# Patient Record
Sex: Female | Born: 1975 | Race: Black or African American | Hispanic: No | Marital: Single | Smoking: Never smoker
Health system: Southern US, Community
[De-identification: ages and names within clinical notes are randomized; demographics above are authoritative.]

## PROBLEM LIST (undated history)

## (undated) ENCOUNTER — Inpatient Hospital Stay (HOSPITAL_COMMUNITY): Payer: Self-pay

## (undated) DIAGNOSIS — Z789 Other specified health status: Secondary | ICD-10-CM

## (undated) DIAGNOSIS — D219 Benign neoplasm of connective and other soft tissue, unspecified: Secondary | ICD-10-CM

---

## 2002-03-27 ENCOUNTER — Other Ambulatory Visit: Admission: RE | Admit: 2002-03-27 | Discharge: 2002-03-27 | Payer: Self-pay | Admitting: *Deleted

## 2003-11-05 ENCOUNTER — Other Ambulatory Visit: Admission: RE | Admit: 2003-11-05 | Discharge: 2003-11-05 | Payer: Self-pay | Admitting: Obstetrics and Gynecology

## 2006-04-20 ENCOUNTER — Other Ambulatory Visit: Admission: RE | Admit: 2006-04-20 | Discharge: 2006-04-20 | Payer: Self-pay | Admitting: Family Medicine

## 2008-06-13 ENCOUNTER — Encounter (INDEPENDENT_AMBULATORY_CARE_PROVIDER_SITE_OTHER): Payer: Self-pay | Admitting: Obstetrics & Gynecology

## 2008-06-13 ENCOUNTER — Ambulatory Visit: Payer: Self-pay | Admitting: Obstetrics & Gynecology

## 2010-10-07 NOTE — Group Therapy Note (Signed)
Tracy Orr, HULEN NO.:  [id number]   MEDICAL RECORD NO.:  [id number]          PATIENT TYPE:  WOC   LOCATION:  [facility]                   FACILITY:  [facility]   PHYSICIAN:  Dorthula Perfect, MD     DATE OF BIRTH:  19-Nov-1975   DATE OF SERVICE:  06/13/2008                                  CLINIC NOTE   A 35 year old black female, gravida 0, last menstrual period May 24, 2008 comes in for annual exam.  Her last examination including Pap  smear was roughly 5-7 years ago.  When she was younger she said she  had an abnormal Pap smear but after that they were normal.  She and  her boyfriend use condoms for contraception.  She has no problems with  her periods.  She has occasional slight vaginal discharge premenstrually  but this might be getting better since she has changed her bath soap.  She has no GI or GU symptoms.   FAMILY HISTORY:  Negative for breast cancer, colon cancer and ovarian  cancer.   PHYSICAL EXAM:  Height 5 feet 9, weight 179 pounds, blood pressure  125/83.  Thyroid completely normal.  BREAST EXAM:  Her breasts are bilaterally normal without cysts or  tenderness.  In the right __________ is a subcutaneous firm but soft  area about 12 mm x 6 mm.  This is most likely a follicular cyst with  hair retention secondary to shaving of the axilla.  She occasionally  sees this similarly in the left axilla.  ABDOMEN:  The abdomen is flat, soft and nontender.  No masses are  palpable.  PELVIC EXAM:  External genitalia, BUS glands are normal.  The vaginal  vault is completely normal.  The cervix is epithelialized and completely  normal.  The uterus is in the midline and is of normal size and shape.  It is nontender.  The ovaries are bilaterally normal.  Adnexal areas are  negative.   IMPRESSION:  Normal gynecology exam.   DISPOSITION:  1. Pap smear.  2. The patient asked if she can at some point go to Pap smears every 2-      3 years.  I told her that  if she will have 2-3 annual Pap smears      that are all normal, then she can certainly consider that.           ______________________________  Dorthula Perfect, MD     ER/MEDQ  D:  06/13/2008  T:  06/13/2008  Job:  [id number]

## 2011-05-25 ENCOUNTER — Other Ambulatory Visit (HOSPITAL_COMMUNITY)
Admission: RE | Admit: 2011-05-25 | Discharge: 2011-05-25 | Disposition: A | Discharge status: Home / Self Care | Payer: BC Managed Care – PPO | Source: Ambulatory Visit | Attending: Obstetrics and Gynecology | Admitting: Obstetrics and Gynecology

## 2011-05-25 ENCOUNTER — Other Ambulatory Visit: Payer: Self-pay | Admitting: Obstetrics and Gynecology

## 2011-05-25 DIAGNOSIS — Z124 Encounter for screening for malignant neoplasm of cervix: Secondary | ICD-10-CM | POA: Insufficient documentation

## 2011-05-25 DIAGNOSIS — Z113 Encounter for screening for infections with a predominantly sexual mode of transmission: Secondary | ICD-10-CM | POA: Insufficient documentation

## 2011-05-25 DIAGNOSIS — Z1159 Encounter for screening for other viral diseases: Secondary | ICD-10-CM | POA: Insufficient documentation

## 2011-06-03 ENCOUNTER — Inpatient Hospital Stay (HOSPITAL_COMMUNITY)
Admission: AD | Admit: 2011-06-03 | Discharge: 2011-06-03 | Disposition: A | Discharge status: Home / Self Care | Payer: BC Managed Care – PPO | Source: Ambulatory Visit | Attending: Obstetrics and Gynecology | Admitting: Obstetrics and Gynecology

## 2011-06-03 ENCOUNTER — Inpatient Hospital Stay (HOSPITAL_COMMUNITY): Payer: BC Managed Care – PPO

## 2011-06-03 ENCOUNTER — Encounter (HOSPITAL_COMMUNITY): Payer: Self-pay

## 2011-06-03 DIAGNOSIS — O99891 Other specified diseases and conditions complicating pregnancy: Secondary | ICD-10-CM | POA: Insufficient documentation

## 2011-06-03 DIAGNOSIS — O3410 Maternal care for benign tumor of corpus uteri, unspecified trimester: Secondary | ICD-10-CM | POA: Insufficient documentation

## 2011-06-03 DIAGNOSIS — R109 Unspecified abdominal pain: Secondary | ICD-10-CM | POA: Insufficient documentation

## 2011-06-03 DIAGNOSIS — D259 Leiomyoma of uterus, unspecified: Secondary | ICD-10-CM | POA: Insufficient documentation

## 2011-06-03 DIAGNOSIS — D219 Benign neoplasm of connective and other soft tissue, unspecified: Secondary | ICD-10-CM

## 2011-06-03 HISTORY — DX: Other specified health status: Z78.9

## 2011-06-03 LAB — WET PREP, GENITAL
Trich, Wet Prep: NONE SEEN
WBC, Wet Prep HPF POC: NONE SEEN
Yeast Wet Prep HPF POC: NONE SEEN

## 2011-06-03 LAB — CBC
Hemoglobin: 13.2 g/dL (ref 12.0–15.0)
Platelets: 230 10*3/uL (ref 150–400)
RBC: 4.25 MIL/uL (ref 3.87–5.11)

## 2011-06-03 LAB — URINALYSIS, ROUTINE W REFLEX MICROSCOPIC
Bilirubin Urine: NEGATIVE
Glucose, UA: NEGATIVE mg/dL
Hgb urine dipstick: NEGATIVE
Specific Gravity, Urine: 1.02 (ref 1.005–1.030)
pH: 7 (ref 5.0–8.0)

## 2011-06-03 LAB — HCG, QUANTITATIVE, PREGNANCY: hCG, Beta Chain, Quant, S: 58656 m[IU]/mL — ABNORMAL HIGH (ref <5)

## 2011-06-03 NOTE — Progress Notes (Signed)
Patient states she has had a positive pregnancy test in Dr. Paralee Cancel office. Has some cramping today but none at this time. Patient denies any bleeding. Was treated for a pos CT last week.

## 2011-06-03 NOTE — ED Provider Notes (Signed)
History     Chief Complaint  Patient presents with  . Abdominal Pain   HPIAngela L Orr is 36 y.o. G1P0 [redacted]w[redacted]d weeks presenting with sharp mid abdominal pain.  Spoke to Tracy Orr who sent her here for further evaluation.  LMP 11/15, normal for her.  Had a + test end of Dec. In office.   Was dx and treated for Chlamydia last week.  Partner treated.  Denies vaginal bleeding or vaginal discharge.  Pain began at 1pm today.  ? If something she ate.  That pain has resolved but has occ cramping.      Past Medical History  Diagnosis Date  . No pertinent past medical history     History reviewed. No pertinent past surgical history.  Family History  Problem Relation Age of Onset  . Diabetes Mother   . Hypertension Mother   . Hypertension Father   . Hypertension Sister     History  Substance Use Topics  . Smoking status: Never Smoker   . Smokeless tobacco: Not on file  . Alcohol Use: No    Allergies: Allergies not on file  No prescriptions prior to admission    Review of Systems  Constitutional: Negative for fever and chills.  Gastrointestinal: Positive for nausea and abdominal pain (sharp mid abdomen). Negative for vomiting, diarrhea and constipation.  Genitourinary: Negative.   Neurological: Negative for headaches.   Physical Exam   Blood pressure 121/77, pulse 89, temperature 98.7 F (37.1 C), temperature source Oral, resp. rate 20, height 5\' 9"  (1.753 Orr), weight 188 lb 9.6 oz (85.548 kg), last menstrual period 04/08/2011, SpO2 99.00%.  Physical Exam  Constitutional: She is oriented to person, place, and time. She appears well-developed and well-nourished. No distress.  HENT:  Head: Normocephalic.  Neck: Normal range of motion.  Cardiovascular: Normal rate.   Respiratory: Effort normal.  GI: Soft. She exhibits no distension and no mass. There is no tenderness. There is no rebound and no guarding.  Genitourinary: Uterus is enlarged (slightly). Uterus is not tender.  Right adnexum displays no mass, no tenderness and no fullness. Left adnexum displays no mass, no tenderness and no fullness. No bleeding around the vagina. Vaginal discharge (frothy discharge with odor) found.  Neurological: She is alert and oriented to person, place, and time.  Skin: Skin is warm and dry.  Psychiatric: She has a normal mood and affect. Her behavior is normal.   Results for orders placed during the hospital encounter of 06/03/11 (from the past 24 hour(s))  URINALYSIS, ROUTINE W REFLEX MICROSCOPIC     Status: Normal   Collection Time   06/03/11  6:40 PM      Component Value Range   Color, Urine YELLOW  YELLOW    APPearance CLEAR  CLEAR    Specific Gravity, Urine 1.020  1.005 - 1.030    pH 7.0  5.0 - 8.0    Glucose, UA NEGATIVE  NEGATIVE (mg/dL)   Hgb urine dipstick NEGATIVE  NEGATIVE    Bilirubin Urine NEGATIVE  NEGATIVE    Ketones, ur NEGATIVE  NEGATIVE (mg/dL)   Protein, ur NEGATIVE  NEGATIVE (mg/dL)   Urobilinogen, UA 0.2  0.0 - 1.0 (mg/dL)   Nitrite NEGATIVE  NEGATIVE    Leukocytes, UA NEGATIVE  NEGATIVE   WET PREP, GENITAL     Status: Abnormal   Collection Time   06/03/11  6:55 PM      Component Value Range   Yeast, Wet Prep NONE SEEN  NONE  SEEN    Trich, Wet Prep NONE SEEN  NONE SEEN    Clue Cells, Wet Prep FEW (*) NONE SEEN    WBC, Wet Prep HPF POC NONE SEEN  NONE SEEN   CBC     Status: Normal   Collection Time   06/03/11  7:30 PM      Component Value Range   WBC 9.6  4.0 - 10.5 (K/uL)   RBC 4.25  3.87 - 5.11 (MIL/uL)   Hemoglobin 13.2  12.0 - 15.0 (g/dL)   HCT 16.1  09.6 - 04.5 (%)   MCV 90.4  78.0 - 100.0 (fL)   MCH 31.1  26.0 - 34.0 (pg)   MCHC 34.4  30.0 - 36.0 (g/dL)   RDW 40.9  81.1 - 91.4 (%)   Platelets 230  150 - 400 (K/uL)  ABO/RH     Status: Normal   Collection Time   06/03/11  7:41 PM      Component Value Range   ABO/RH(D) O POS    HCG, QUANTITATIVE, PREGNANCY     Status: Abnormal   Collection Time   06/03/11  7:41 PM      Component Value  Range   hCG, Beta Chain, Quant, S [id number] (*) <5 (mIU/mL)   MAU Course  Procedures  TEST OF CURE has been scheduled in Dr. Paralee Cancel office next week.  Not repeated today.  MDM 21:00 care turned over to Tracy Orr, CNM. Assessment and Plan  A:  Abdominal pain in early pregnancy  P:  Keep your scheduled appointment with Tracy Orr.    Tracy Orr 06/03/2011, 6:57 PM   Tracy Holmes, NP 06/03/11 2055  Ultrasound results:  IUP at 6.6 wks; +fetal heart rate 144; multiple fibroids.    A:  Fibroids IUP  Plan: DC to home Keep scheduled appointment.  [facility]

## 2011-06-16 ENCOUNTER — Other Ambulatory Visit: Payer: Self-pay

## 2011-06-23 ENCOUNTER — Other Ambulatory Visit: Payer: Self-pay | Admitting: Obstetrics and Gynecology

## 2011-06-23 DIAGNOSIS — O09529 Supervision of elderly multigravida, unspecified trimester: Secondary | ICD-10-CM

## 2011-06-23 DIAGNOSIS — Z3689 Encounter for other specified antenatal screening: Secondary | ICD-10-CM

## 2011-08-05 ENCOUNTER — Encounter (HOSPITAL_COMMUNITY): Payer: Self-pay

## 2011-08-19 ENCOUNTER — Encounter (HOSPITAL_COMMUNITY): Payer: Self-pay

## 2011-08-19 ENCOUNTER — Ambulatory Visit (HOSPITAL_COMMUNITY)
Admission: RE | Admit: 2011-08-19 | Discharge: 2011-08-19 | Disposition: A | Discharge status: Home / Self Care | Payer: BC Managed Care – PPO | Source: Ambulatory Visit | Attending: Obstetrics and Gynecology | Admitting: Obstetrics and Gynecology

## 2011-08-19 DIAGNOSIS — O3410 Maternal care for benign tumor of corpus uteri, unspecified trimester: Secondary | ICD-10-CM | POA: Insufficient documentation

## 2011-08-19 DIAGNOSIS — O09519 Supervision of elderly primigravida, unspecified trimester: Secondary | ICD-10-CM | POA: Insufficient documentation

## 2011-08-19 DIAGNOSIS — Z1389 Encounter for screening for other disorder: Secondary | ICD-10-CM | POA: Insufficient documentation

## 2011-08-19 DIAGNOSIS — Z363 Encounter for antenatal screening for malformations: Secondary | ICD-10-CM | POA: Insufficient documentation

## 2011-08-19 DIAGNOSIS — O09529 Supervision of elderly multigravida, unspecified trimester: Secondary | ICD-10-CM

## 2011-08-19 DIAGNOSIS — O358XX0 Maternal care for other (suspected) fetal abnormality and damage, not applicable or unspecified: Secondary | ICD-10-CM | POA: Insufficient documentation

## 2011-08-19 DIAGNOSIS — Z3689 Encounter for other specified antenatal screening: Secondary | ICD-10-CM

## 2011-08-25 NOTE — Progress Notes (Signed)
Genetic Counseling  High-Risk Gestation Note  Appointment Date: 08/18/11 Referred By: Fortino Sic, MD Date of Birth:  Nov 01, 1975  Pregnancy History: G1P0000 Estimated Date of Delivery: 01/21/12 Estimated Gestational Age: [redacted]w[redacted]d  Ms. Shelva Majestic was seen for genetic counseling because of a maternal age of 36.     She was counseled regarding maternal age and the association with risk for chromosome conditions due to nondisjunction with aging of the ova.   We reviewed chromosomes, nondisjunction, and the associated 1 in 125 risk for fetal aneuploidy related to a maternal age of 36 y.o. at [redacted]w[redacted]d gestation.  She was/They were counseled that the risk for aneuploidy decreases as gestational age increases, accounting for those pregnancies which spontaneously abort.  We specifically discussed Down syndrome (trisomy 38), trisomies 57 and 75, and sex chromosome aneuploidies (47,XXX and 47,XXY) including the common features and prognoses of each.   We reviewed available screening and diagnostic options.  Regarding screening tests, we discussed the options of Quad screen and ultrasound. In addition, we discussed another type of screening test, noninvasive prenatal testing (NIPT), which utilizes cell free fetal DNA found in the maternal circulation. This test is not diagnostic for chromosome conditions, but can provide information regarding the presence or absence of extra fetal DNA for chromosomes 13, 18 and 21. Thus, it would not identify or rule out all fetal aneuploidy. The reported detection rate is greater than 99% for Trisomy 21, greater than 97% for Trisomy 18, and is approximately 80% (8 out of 10) for Trisomy 13. The false positive rate is reported to be less than 1% for any of these conditions.  She understands that screening tests are used to modify a patient's a priori risk for aneuploidy, typically based on age.  This estimate provides a pregnancy specific risk assessment.    We also reviewed  the availability of diagnostic testing via amniocentesis.  We discussed the risks, limitations, and benefits of each.  After reviewing these options, Ms. Neely elected to have ultrasound, but declined all other screening and diagnostic options. She understands that ultrasound cannot rule out all birth defects or genetic syndromes. The patient was advised of this limitation and states she still does not want diagnostic testing at this time.  However, she was counseled that 50-80% of fetuses with Down syndrome and up to 90% of fetuses with trisomies 13 and 18, when well visualized, have detectable anomalies or soft markers by ultrasound.   Ms. Burck was provided with written information regarding sickle cell anemia (SCA) including the carrier frequency and incidence in the Hispanic/African-American population, the availability of carrier testing and prenatal diagnosis if indicated.  In addition, we discussed that hemoglobinopathies are routinely screened for as part of the [place] newborn screening panel.  She declined hemoglobin electrophoresis today.  Both family histories were reviewed and found to be noncontributory for birth defects, mental retardation, and known genetic conditions. Without further information regarding the provided family history, an accurate genetic risk cannot be calculated. Further genetic counseling is warranted if more information is obtained.  Ms. Pekar denied exposure to environmental toxins or chemical agents. She denied the use of tobacco and street drugs.  She reported that she had 2 or 3 glasses of wine during the first 4 weeks of pregnancy.  We reviewed the all or none period and the timing of organogenesis.  She denied significant viral illnesses during the course of her pregnancy. Her medical and surgical histories were noncontributory.   I counseled Ms.  Beery regarding the above risks and available options.  The approximate face-to-face time with the genetic counselor was 40  minutes.  Donald Prose, MS  Certified Genetic Counselor

## 2011-12-31 ENCOUNTER — Encounter (HOSPITAL_COMMUNITY): Payer: Self-pay | Admitting: *Deleted

## 2011-12-31 ENCOUNTER — Inpatient Hospital Stay (HOSPITAL_COMMUNITY)
Admission: AD | Admit: 2011-12-31 | Discharge: 2012-01-01 | Disposition: A | Discharge status: Home / Self Care | Payer: BC Managed Care – PPO | Source: Ambulatory Visit | Attending: Obstetrics & Gynecology | Admitting: Obstetrics & Gynecology

## 2011-12-31 DIAGNOSIS — O479 False labor, unspecified: Secondary | ICD-10-CM | POA: Insufficient documentation

## 2011-12-31 NOTE — Progress Notes (Signed)
Pt states she had fibroids

## 2011-12-31 NOTE — MAU Note (Signed)
Pt states pain started at 1900 q 4-5 min.Pt states since she has been here her pains have subsided

## 2011-12-31 NOTE — MAU Note (Signed)
Pt G1 at 37wks having contractions every since 1930 and feeling pressure.  Denies bleeding or leaking fluid.

## 2012-01-01 NOTE — Progress Notes (Signed)
Pt sitting up in bed loss  Of contact noted

## 2012-01-18 ENCOUNTER — Encounter (HOSPITAL_COMMUNITY): Payer: Self-pay | Admitting: *Deleted

## 2012-01-18 ENCOUNTER — Inpatient Hospital Stay (HOSPITAL_COMMUNITY): Payer: BC Managed Care – PPO | Admitting: Anesthesiology

## 2012-01-18 ENCOUNTER — Encounter (HOSPITAL_COMMUNITY): Payer: Self-pay | Admitting: Anesthesiology

## 2012-01-18 ENCOUNTER — Inpatient Hospital Stay (HOSPITAL_COMMUNITY)
Admission: AD | Admit: 2012-01-18 | Discharge: 2012-01-21 | DRG: 372 | Disposition: A | Discharge status: Home / Self Care | Payer: BC Managed Care – PPO | Source: Ambulatory Visit | Attending: Obstetrics and Gynecology | Admitting: Obstetrics and Gynecology

## 2012-01-18 DIAGNOSIS — D259 Leiomyoma of uterus, unspecified: Secondary | ICD-10-CM | POA: Diagnosis present

## 2012-01-18 DIAGNOSIS — O09519 Supervision of elderly primigravida, unspecified trimester: Secondary | ICD-10-CM | POA: Diagnosis present

## 2012-01-18 DIAGNOSIS — O34599 Maternal care for other abnormalities of gravid uterus, unspecified trimester: Secondary | ICD-10-CM | POA: Diagnosis present

## 2012-01-18 DIAGNOSIS — O4690 Antepartum hemorrhage, unspecified, unspecified trimester: Secondary | ICD-10-CM | POA: Diagnosis present

## 2012-01-18 DIAGNOSIS — D4959 Neoplasm of unspecified behavior of other genitourinary organ: Secondary | ICD-10-CM | POA: Diagnosis present

## 2012-01-18 HISTORY — DX: Benign neoplasm of connective and other soft tissue, unspecified: D21.9

## 2012-01-18 LAB — OB RESULTS CONSOLE HEPATITIS B SURFACE ANTIGEN: Hepatitis B Surface Ag: NEGATIVE

## 2012-01-18 LAB — CBC
Hemoglobin: 12.9 g/dL (ref 12.0–15.0)
RBC: 4.31 MIL/uL (ref 3.87–5.11)
WBC: 12.8 10*3/uL — ABNORMAL HIGH (ref 4.0–10.5)

## 2012-01-18 LAB — OB RESULTS CONSOLE HIV ANTIBODY (ROUTINE TESTING): HIV: NONREACTIVE

## 2012-01-18 LAB — OB RESULTS CONSOLE GBS: GBS: NEGATIVE

## 2012-01-18 MED ORDER — LIDOCAINE HCL (PF) 1 % IJ SOLN
INTRAMUSCULAR | Status: DC | PRN
  Administered 2012-01-18 (×4): 4 mL

## 2012-01-18 MED ORDER — LACTATED RINGERS IV SOLN: 500.0000 mL | Freq: Once | INTRAVENOUS | Status: DC

## 2012-01-18 MED ORDER — OXYTOCIN BOLUS FROM INFUSION
250.0000 mL | Freq: Once | INTRAVENOUS | Status: AC
  Administered 2012-01-19: 250 mL via INTRAVENOUS
  Filled 2012-01-18: qty 500

## 2012-01-18 MED ORDER — OXYTOCIN 40 UNITS IN LACTATED RINGERS INFUSION - SIMPLE MED: 1.0000 m[IU]/min | INTRAVENOUS | Status: DC

## 2012-01-18 MED ORDER — LACTATED RINGERS IV SOLN
INTRAVENOUS | Status: DC
  Administered 2012-01-18: 100 mL/h via INTRAUTERINE
  Administered 2012-01-18: 500 mL via INTRAUTERINE

## 2012-01-18 MED ORDER — LIDOCAINE HCL (PF) 1 % IJ SOLN
30.0000 mL | INTRAMUSCULAR | Status: DC | PRN
  Filled 2012-01-18: qty 30

## 2012-01-18 MED ORDER — TERBUTALINE SULFATE 1 MG/ML IJ SOLN: 0.2500 mg | Freq: Once | INTRAMUSCULAR | Status: AC | PRN

## 2012-01-18 MED ORDER — OXYCODONE-ACETAMINOPHEN 5-325 MG PO TABS
1.0000 | ORAL_TABLET | ORAL | Status: DC | PRN
  Administered 2012-01-19: 1 via ORAL
  Filled 2012-01-18: qty 1

## 2012-01-18 MED ORDER — FENTANYL 2.5 MCG/ML BUPIVACAINE 1/10 % EPIDURAL INFUSION (WH - ANES)
14.0000 mL/h | INTRAMUSCULAR | Status: DC
  Administered 2012-01-18: 14 mL/h via EPIDURAL
  Administered 2012-01-18: 16 mL/h via EPIDURAL
  Administered 2012-01-18: 14 mL/h via EPIDURAL
  Filled 2012-01-18 (×3): qty 60

## 2012-01-18 MED ORDER — DIPHENHYDRAMINE HCL 50 MG/ML IJ SOLN: 12.5000 mg | INTRAMUSCULAR | Status: DC | PRN

## 2012-01-18 MED ORDER — LACTATED RINGERS IV SOLN
INTRAVENOUS | Status: DC
  Administered 2012-01-18: 125 mL/h via INTRAVENOUS

## 2012-01-18 MED ORDER — ONDANSETRON HCL 4 MG/2ML IJ SOLN
4.0000 mg | Freq: Four times a day (QID) | INTRAMUSCULAR | Status: DC | PRN
  Administered 2012-01-18 (×2): 4 mg via INTRAVENOUS
  Filled 2012-01-18 (×2): qty 2

## 2012-01-18 MED ORDER — LACTATED RINGERS IV SOLN
500.0000 mL | INTRAVENOUS | Status: DC | PRN
  Administered 2012-01-18: 1000 mL via INTRAVENOUS
  Administered 2012-01-18: 300 mL via INTRAVENOUS
  Administered 2012-01-19: 500 mL via INTRAVENOUS

## 2012-01-18 MED ORDER — PHENYLEPHRINE 40 MCG/ML (10ML) SYRINGE FOR IV PUSH (FOR BLOOD PRESSURE SUPPORT): 80.0000 ug | PREFILLED_SYRINGE | INTRAVENOUS | Status: DC | PRN

## 2012-01-18 MED ORDER — OXYTOCIN 40 UNITS IN LACTATED RINGERS INFUSION - SIMPLE MED
62.5000 mL/h | Freq: Once | INTRAVENOUS | Status: DC
  Filled 2012-01-18: qty 1000

## 2012-01-18 MED ORDER — EPHEDRINE 5 MG/ML INJ: 10.0000 mg | INTRAVENOUS | Status: DC | PRN

## 2012-01-18 MED ORDER — IBUPROFEN 600 MG PO TABS
600.0000 mg | ORAL_TABLET | Freq: Four times a day (QID) | ORAL | Status: DC | PRN
  Administered 2012-01-19: 600 mg via ORAL
  Filled 2012-01-18: qty 1

## 2012-01-18 MED ORDER — FLEET ENEMA 7-19 GM/118ML RE ENEM: 1.0000 | ENEMA | RECTAL | Status: DC | PRN

## 2012-01-18 MED ORDER — CITRIC ACID-SODIUM CITRATE 334-500 MG/5ML PO SOLN: 30.0000 mL | ORAL | Status: DC | PRN

## 2012-01-18 MED ORDER — ACETAMINOPHEN 325 MG PO TABS: 650.0000 mg | ORAL_TABLET | ORAL | Status: DC | PRN

## 2012-01-18 MED ORDER — EPHEDRINE 5 MG/ML INJ
10.0000 mg | INTRAVENOUS | Status: DC | PRN
  Filled 2012-01-18: qty 4

## 2012-01-18 MED ORDER — PHENYLEPHRINE 40 MCG/ML (10ML) SYRINGE FOR IV PUSH (FOR BLOOD PRESSURE SUPPORT)
80.0000 ug | PREFILLED_SYRINGE | INTRAVENOUS | Status: DC | PRN
  Filled 2012-01-18: qty 5

## 2012-01-18 NOTE — Anesthesia Preprocedure Evaluation (Addendum)
Anesthesia Evaluation  Patient identified by MRN, date of birth, ID band Patient awake    Reviewed: Allergy & Precautions, H&P , Patient's Chart, lab work & pertinent test results  Airway Mallampati: III TM Distance: >3 FB Neck ROM: full    Dental  (+) Teeth Intact   Pulmonary  breath sounds clear to auscultation        Cardiovascular Rhythm:regular Rate:Normal     Neuro/Psych    GI/Hepatic   Endo/Other    Renal/GU      Musculoskeletal   Abdominal   Peds  Hematology   Anesthesia Other Findings       Reproductive/Obstetrics (+) Pregnancy                           Anesthesia Physical Anesthesia Plan  ASA: II  Anesthesia Plan: Epidural   Post-op Pain Management:    Induction:   Airway Management Planned:   Additional Equipment:   Intra-op Plan:   Post-operative Plan:   Informed Consent: I have reviewed the patients History and Physical, chart, labs and discussed the procedure including the risks, benefits and alternatives for the proposed anesthesia with the patient or authorized representative who has indicated his/her understanding and acceptance.   Dental Advisory Given  Plan Discussed with:   Anesthesia Plan Comments: (Labs checked- platelets confirmed with RN in room. Fetal heart tracing, per RN, reported to be stable enough for sitting procedure. Discussed epidural, and patient consents to the procedure:  included risk of possible headache,backache, failed block, allergic reaction, and nerve injury. This patient was asked if she had any questions or concerns before the procedure started. )        Anesthesia Quick Evaluation  

## 2012-01-18 NOTE — Progress Notes (Signed)
Tracy Orr is a 36 y.o. G1P0000 at [redacted]w[redacted]d by LMP admitted for active labor  Subjective: Comfortable with CLE  Objective: BP 108/53  Pulse 63  Temp 98 F (36.7 C) (Oral)  Resp 18  Ht 5\' 10"  (1.778 m)  Wt 98.884 kg (218 lb)  BMI 31.28 kg/m2  SpO2 100%  LMP 04/08/2011 I/O last 3 completed shifts: In: -  Out: 250 [Urine:250]   Pt aware of the R/B/A spontaneous - poitocin - amnioinfusion -forceps-(nerve injury) - vacuum (cephalohematoma)- and CSx FHT:  FHR: 130s bpm, variability: moderate,  accelerations:  Present,  decelerations:  Absent UC:   regular, every 2-5 minutes SVE:   Dilation: 6 Effacement (%): 90 Station: -1 Exam by:: Dr Christell Constant  Labs: Lab Results  Component Value Date   WBC 12.8* 01/18/2012   HGB 12.9 01/18/2012   HCT 38.2 01/18/2012   MCV 88.6 01/18/2012   PLT 267 01/18/2012    Assessment / Plan: Augmentation of labor, progressing well  Labor: Progressing normally Preeclampsia:  na Fetal Wellbeing:  Category I Pain Control:  Epidural I/D:  n/a Anticipated MOD:  NSVD  Cypert, Gared Gillie H. 01/18/2012, 7:14 PM

## 2012-01-18 NOTE — Progress Notes (Signed)
MD notified of pt status, FHR, MVUs, SVE and pitocin level. Will continue to monitor.

## 2012-01-18 NOTE — Anesthesia Procedure Notes (Signed)
Epidural Patient location during procedure: OB Start time: 01/18/2012 5:08 PM Reason for block: procedure for pain  Staffing Performed by: anesthesiologist   Preanesthetic Checklist Completed: patient identified, site marked, surgical consent, pre-op evaluation, timeout performed, IV checked, risks and benefits discussed and monitors and equipment checked  Epidural Patient position: sitting Prep: site prepped and draped and DuraPrep Patient monitoring: continuous pulse ox and blood pressure Approach: midline Injection technique: LOR air  Needle:  Needle type: Tuohy  Needle gauge: 17 G Needle length: 9 cm Needle insertion depth: 8 cm Catheter type: closed end flexible Catheter size: 19 Gauge Catheter at skin depth: 13 cm Test dose: negative  Assessment Events: blood not aspirated, injection not painful, no injection resistance, negative IV test and no paresthesia  Additional Notes Discussed risk of headache, infection, bleeding, nerve injury and failed or incomplete block.  Patient voices understanding and wishes to proceed.

## 2012-01-18 NOTE — H&P (Signed)
Tracy Orr is a 36 y.o. female presenting for some spotting and contractions. Maternal Medical History:  Reason for admission: Reason for admission: contractions, vaginal bleeding and nausea.  Contractions: Onset was 3-5 hours ago.   Frequency: irregular.   Perceived severity is moderate.    Fetal activity: Perceived fetal activity is normal.   Last perceived fetal movement was within the past hour.    Prenatal complications: Bleeding.   No cholelithiasis, HIV, hypertension, infection, IUGR, nephrolithiasis, oligohydramnios, placental abnormality, polyhydramnios, pre-eclampsia, preterm labor, substance abuse or thrombocytopenia.     OB History    Grav Para Term Preterm Abortions TAB SAB Ect Mult Living   1 0 0 0 0 0 0 0 0 0      Past Medical History  Diagnosis Date  . No pertinent past medical history   . Fibroid    History reviewed. No pertinent past surgical history. Family History: family history includes Diabetes in her mother and Hypertension in her father, mother, and sister. Social History:  reports that she has never smoked. She does not have any smokeless tobacco history on file. She reports that she does not drink alcohol or use illicit drugs.   Prenatal Transfer Tool  Maternal Diabetes: No Genetic Screening: Normal Maternal Ultrasounds/Referrals: Normal Fetal Ultrasounds or other Referrals:  None Maternal Substance Abuse:  No Significant Maternal Medications:  None Significant Maternal Lab Results:  None Other Comments:  None  Review of Systems  Constitutional: Negative for fever and chills.  HENT: Negative for ear pain, congestion, tinnitus and ear discharge.   Eyes: Negative for blurred vision.  Respiratory: Negative for cough, shortness of breath and wheezing.   Cardiovascular: Negative for chest pain.  Gastrointestinal: Positive for nausea and abdominal pain. Negative for heartburn, vomiting, diarrhea and constipation.  Genitourinary: Positive for  frequency. Negative for dysuria.  Musculoskeletal: Negative.  Negative for falls.  Skin: Negative for rash.  Neurological: Negative for dizziness and headaches.  Endo/Heme/Allergies: Does not bruise/bleed easily.  Psychiatric/Behavioral: Negative.     Dilation: 3.5 Effacement (%): 80 Station: -2 Exam by:: Dherr rn Blood pressure 125/88, pulse 88, temperature 98.2 F (36.8 C), resp. rate 20, height 5\' 10"  (1.778 m), weight 98.884 kg (218 lb), last menstrual period 04/08/2011. Maternal Exam:  Uterine Assessment: Contraction strength is moderate.  Contraction duration is 45 seconds. Contraction frequency is regular.   Abdomen: Fundal height is term.   Estimated fetal weight is 7#10.   Fetal presentation: vertex  Introitus: Normal vulva. Normal vagina.  Ferning test: not done.  Nitrazine test: not done. Amniotic fluid character: meconium stained. Thick particulate  Cervix: Cervix evaluated by digital exam.   4/80/-3 --- AROM revealed tick meconium and green and IUPC placed  Physical Exam  Constitutional: She is oriented to person, place, and time. She appears well-developed and well-nourished.  HENT:  Head: Normocephalic and atraumatic.  Mouth/Throat: No oropharyngeal exudate.  Eyes: Pupils are equal, round, and reactive to light. Right eye exhibits no discharge. Left eye exhibits no discharge.  Neck: Normal range of motion. Neck supple. No tracheal deviation present. No thyromegaly present.  Cardiovascular: Normal rate.   Respiratory: Effort normal and breath sounds normal. No respiratory distress. She has no wheezes.  GI: Soft. There is no tenderness.  Genitourinary: Vagina normal and uterus normal.  Musculoskeletal: Normal range of motion.  Neurological: She is alert and oriented to person, place, and time.  Skin: Skin is warm and dry. No rash noted.  Psychiatric: She has a normal mood  and affect. Her behavior is normal. Judgment and thought content normal.    Prenatal  labs: ABO, Rh: --/--/O POS (01/09 1941) Antibody:   Rubella: Immune (08/26 0000) RPR: Nonreactive (08/26 0000)  HBsAg: Negative (08/26 0000)  HIV: Non-reactive (08/26 0000)  GBS: Negative (08/26 0000)  2-5 minutes and pelvis adequate with >90 arch and spines average and sacrum hollow and ob conjugate >12 cm and is a candidate for TOL Assessment/Plan: IUP 39 weeks and 4 days Third trimester bleeding Early labor Advanced maternal age Fibroid uterus Thick meconium   We'll plan to admit and manage labor and delivery  Start amnioinfusion  will augment labor  Bert, Tracy Riede H. 01/18/2012, 1:35 PM

## 2012-01-19 ENCOUNTER — Encounter (HOSPITAL_COMMUNITY): Payer: Self-pay | Admitting: Family Medicine

## 2012-01-19 LAB — CBC
Hemoglobin: 11.5 g/dL — ABNORMAL LOW (ref 12.0–15.0)
MCH: 30.3 pg (ref 26.0–34.0)
RBC: 3.8 MIL/uL — ABNORMAL LOW (ref 3.87–5.11)

## 2012-01-19 LAB — RPR: RPR Ser Ql: NONREACTIVE

## 2012-01-19 MED ORDER — LANOLIN HYDROUS EX OINT: TOPICAL_OINTMENT | CUTANEOUS | Status: DC | PRN

## 2012-01-19 MED ORDER — IBUPROFEN 600 MG PO TABS
600.0000 mg | ORAL_TABLET | Freq: Four times a day (QID) | ORAL | Status: DC
  Administered 2012-01-19 – 2012-01-21 (×9): 600 mg via ORAL
  Filled 2012-01-19 (×9): qty 1

## 2012-01-19 MED ORDER — WITCH HAZEL-GLYCERIN EX PADS: 1.0000 "application " | MEDICATED_PAD | CUTANEOUS | Status: DC | PRN

## 2012-01-19 MED ORDER — BENZOCAINE-MENTHOL 20-0.5 % EX AERO
1.0000 "application " | INHALATION_SPRAY | CUTANEOUS | Status: DC | PRN
  Filled 2012-01-19: qty 56

## 2012-01-19 MED ORDER — SIMETHICONE 80 MG PO CHEW: 80.0000 mg | CHEWABLE_TABLET | ORAL | Status: DC | PRN

## 2012-01-19 MED ORDER — OXYCODONE-ACETAMINOPHEN 5-325 MG PO TABS
1.0000 | ORAL_TABLET | ORAL | Status: DC | PRN
  Administered 2012-01-19: 2 via ORAL
  Filled 2012-01-19: qty 2

## 2012-01-19 MED ORDER — ONDANSETRON HCL 4 MG PO TABS: 4.0000 mg | ORAL_TABLET | ORAL | Status: DC | PRN

## 2012-01-19 MED ORDER — TETANUS-DIPHTH-ACELL PERTUSSIS 5-2.5-18.5 LF-MCG/0.5 IM SUSP: 0.5000 mL | Freq: Once | INTRAMUSCULAR | Status: DC

## 2012-01-19 MED ORDER — SENNOSIDES-DOCUSATE SODIUM 8.6-50 MG PO TABS
2.0000 | ORAL_TABLET | Freq: Every day | ORAL | Status: DC
  Administered 2012-01-19 – 2012-01-20 (×2): 2 via ORAL

## 2012-01-19 MED ORDER — ZOLPIDEM TARTRATE 5 MG PO TABS: 5.0000 mg | ORAL_TABLET | Freq: Every evening | ORAL | Status: DC | PRN

## 2012-01-19 MED ORDER — PRENATAL MULTIVITAMIN CH
1.0000 | ORAL_TABLET | Freq: Every day | ORAL | Status: DC
  Administered 2012-01-19 – 2012-01-21 (×3): 1 via ORAL
  Filled 2012-01-19 (×2): qty 1

## 2012-01-19 MED ORDER — ONDANSETRON HCL 4 MG/2ML IJ SOLN: 4.0000 mg | INTRAMUSCULAR | Status: DC | PRN

## 2012-01-19 MED ORDER — DIPHENHYDRAMINE HCL 25 MG PO CAPS: 25.0000 mg | ORAL_CAPSULE | Freq: Four times a day (QID) | ORAL | Status: DC | PRN

## 2012-01-19 MED ORDER — DIBUCAINE 1 % RE OINT: 1.0000 "application " | TOPICAL_OINTMENT | RECTAL | Status: DC | PRN

## 2012-01-19 NOTE — Progress Notes (Signed)
Post Partum Day of delivery Subjective: no complaints  Objective: Blood pressure 114/76, pulse 68, temperature 98.7 F (37.1 C), temperature source Oral, resp. rate 16, height 5\' 10"  (1.778 m), weight 98.884 kg (218 lb), last menstrual period 04/08/2011, SpO2 100.00%, unknown if currently breastfeeding.  Physical Exam:  General: alert, cooperative and no distress Lochia: appropriate Uterine Fundus: firm Incision: na DVT Evaluation: No evidence of DVT seen on physical exam. Negative Homan's sign. No cords or calf tenderness. No significant calf/ankle edema.   Basename 01/19/12 0515 01/18/12 1330  HGB 11.5* 12.9  HCT 33.7* 38.2    Assessment/Plan: Breastfeeding and Lactation consult Plan for Discharge THU am - pending peds evaluation with thick mecomiun   LOS: 1 day   [place], Kelsie Kramp H. 01/19/2012, 10:23 AM

## 2012-01-19 NOTE — Anesthesia Postprocedure Evaluation (Signed)
  Anesthesia Post-op Note  Patient: Tracy Orr  Procedure(s) Performed: * No procedures listed *  Patient Location: PACU and Mother/Baby  Anesthesia Type: Epidural  Level of Consciousness: awake, alert , oriented and patient cooperative  Airway and Oxygen Therapy: Patient Spontanous Breathing  Post-op Pain: none  Post-op Assessment: Post-op Vital signs reviewed and Patient's Cardiovascular Status Stable  Post-op Vital Signs: Reviewed and stable  Complications: No apparent anesthesia complications

## 2012-01-19 NOTE — Op Note (Signed)
Delivery Note At 12:26 AM a viable and healthy female was delivered via Vaginal, Spontaneous Delivery (Presentation: ; Occiput Anterior).  APGAR:7 , 9; weight 7 lb 9 oz (3430 g).   Placenta status:delivered to pathology , .  Cord: 3 vessels with the following complications: None.  Cord pH: not obtained  Anesthesia: Epidural  Episiotomy: None Lacerations: 1st degree;Periurethral Suture Repair: 2.0 vicryl rapide one figure of eight to the right of the urethra Est. Blood Loss (mL): 300  Mom to postpartum.  Baby to nursery-stable.  [place]elle, Lanard Arguijo H. 01/19/2012, 12:44 AM

## 2012-01-20 NOTE — Progress Notes (Signed)
Post Partum Day 1 Subjective: no complaints  Objective: Blood pressure 123/78, pulse 79, temperature 98.3 F (36.8 C), temperature source Oral, resp. rate 18, height 5\' 10"  (1.778 m), weight 98.884 kg (218 lb), last menstrual period 04/08/2011, SpO2 100.00%, unknown if currently breastfeeding.  Physical Exam:  General: alert, cooperative and no distress Lochia: appropriate Uterine Fundus: firm Incision: na DVT Evaluation: No evidence of DVT seen on physical exam. Negative Homan's sign. No cords or calf tenderness. No significant calf/ankle edema.   Basename 01/19/12 0515 01/18/12 1330  HGB 11.5* 12.9  HCT 33.7* 38.2    Assessment/Plan: Plan for discharge tomorrow, Breastfeeding and Lactation consult   LOS: 2 days   Orr, Tracy Marken H. 01/20/2012, 12:27 PM

## 2012-01-21 DIAGNOSIS — D259 Leiomyoma of uterus, unspecified: Secondary | ICD-10-CM

## 2012-01-21 NOTE — Discharge Summary (Signed)
Obstetric Discharge Summary Reason for Admission: onset of labor and beeding 3rd trimester Prenatal Procedures: none Intrapartum Procedures: spontaneous vaginal delivery and IUPC and CLE and amnioinfusion Postpartum Procedures: none Complications-Operative and Postpartum: none and right periurethral repaired with one figure of eight Hemoglobin  Date Value Range Status  01/19/2012 11.5* 12.0 - 15.0 g/dL Final     HCT  Date Value Range Status  01/19/2012 33.7* 36.0 - 46.0 % Final    Physical Exam:  General: alert, cooperative and no distress Lochia: appropriate Uterine Fundus: wnl Incision: na DVT Evaluation: No evidence of DVT seen on physical exam. Negative Homan's sign. No cords or calf tenderness. No significant calf/ankle edema.  Discharge Diagnoses: Term Pregnancy-delivered and thick meconium and 3rd trimester bleeding and fibroids and   Discharge Information: Date: 01/21/2012 Activity: unrestricted Diet: routine Medications: PNV, Ibuprofen and tylenol Condition: stable and delivered Instructions: refer to practice specific booklet Discharge to: home   Newborn Data: Live born female  Birth Weight: 7 lb 9 oz (3430 g) APGAR: 7, 9  Home with mother.  [place], Jerimey Burridge H. 01/21/2012, 9:25 AM

## 2012-01-21 NOTE — Progress Notes (Signed)
Patient ID: Tracy Orr, female   DOB: 09/01/1975, 36 y.o.   MRN: [id number] See dc summary

## 2012-01-22 LAB — TYPE AND SCREEN
Antibody Screen: NEGATIVE
Unit division: 0

## 2012-07-17 ENCOUNTER — Emergency Department (HOSPITAL_BASED_OUTPATIENT_CLINIC_OR_DEPARTMENT_OTHER): Payer: No Typology Code available for payment source

## 2012-07-17 ENCOUNTER — Encounter (HOSPITAL_BASED_OUTPATIENT_CLINIC_OR_DEPARTMENT_OTHER): Payer: Self-pay | Admitting: *Deleted

## 2012-07-17 ENCOUNTER — Emergency Department (HOSPITAL_BASED_OUTPATIENT_CLINIC_OR_DEPARTMENT_OTHER)
Admission: EM | Admit: 2012-07-17 | Discharge: 2012-07-17 | Disposition: A | Discharge status: Home / Self Care | Payer: No Typology Code available for payment source | Attending: Emergency Medicine | Admitting: Emergency Medicine

## 2012-07-17 DIAGNOSIS — Z8742 Personal history of other diseases of the female genital tract: Secondary | ICD-10-CM | POA: Insufficient documentation

## 2012-07-17 DIAGNOSIS — M545 Low back pain, unspecified: Secondary | ICD-10-CM | POA: Insufficient documentation

## 2012-07-17 DIAGNOSIS — S139XXA Sprain of joints and ligaments of unspecified parts of neck, initial encounter: Secondary | ICD-10-CM | POA: Insufficient documentation

## 2012-07-17 DIAGNOSIS — Y92410 Unspecified street and highway as the place of occurrence of the external cause: Secondary | ICD-10-CM | POA: Insufficient documentation

## 2012-07-17 DIAGNOSIS — Y9389 Activity, other specified: Secondary | ICD-10-CM | POA: Insufficient documentation

## 2012-07-17 DIAGNOSIS — Z79899 Other long term (current) drug therapy: Secondary | ICD-10-CM | POA: Insufficient documentation
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
## 2014-03-26 ENCOUNTER — Encounter (HOSPITAL_BASED_OUTPATIENT_CLINIC_OR_DEPARTMENT_OTHER): Payer: Self-pay | Admitting: *Deleted
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
## 2017-06-18 ENCOUNTER — Other Ambulatory Visit: Payer: Self-pay

## 2017-06-18 ENCOUNTER — Encounter (HOSPITAL_BASED_OUTPATIENT_CLINIC_OR_DEPARTMENT_OTHER): Payer: Self-pay

## 2017-06-18 ENCOUNTER — Emergency Department (HOSPITAL_BASED_OUTPATIENT_CLINIC_OR_DEPARTMENT_OTHER)
Admission: EM | Admit: 2017-06-18 | Discharge: 2017-06-18 | Disposition: A | Discharge status: Home / Self Care | Payer: BC Managed Care – PPO | Attending: Emergency Medicine | Admitting: Emergency Medicine

## 2017-06-18 DIAGNOSIS — Z79899 Other long term (current) drug therapy: Secondary | ICD-10-CM | POA: Insufficient documentation

## 2017-06-18 DIAGNOSIS — R0981 Nasal congestion: Secondary | ICD-10-CM | POA: Diagnosis present

## 2017-06-18 DIAGNOSIS — J111 Influenza due to unidentified influenza virus with other respiratory manifestations: Secondary | ICD-10-CM | POA: Insufficient documentation

## 2017-06-18 DIAGNOSIS — R69 Illness, unspecified: Secondary | ICD-10-CM

## 2017-06-18 MED ORDER — ALBUTEROL SULFATE HFA 108 (90 BASE) MCG/ACT IN AERS
2.0000 | INHALATION_SPRAY | Freq: Once | RESPIRATORY_TRACT | Status: AC
  Administered 2017-06-18: 2 via RESPIRATORY_TRACT
  Filled 2017-06-18: qty 6.7

## 2017-06-18 MED ORDER — AEROCHAMBER PLUS W/MASK MISC: 2 refills | Status: DC

## 2017-06-18 MED ORDER — ACETAMINOPHEN 325 MG PO TABS
650.0000 mg | ORAL_TABLET | Freq: Once | ORAL | Status: AC | PRN
  Administered 2017-06-18: 650 mg via ORAL
  Filled 2017-06-18: qty 2

## 2017-06-18 MED ORDER — HYDROCODONE-HOMATROPINE 5-1.5 MG/5ML PO SYRP: ORAL_SOLUTION | ORAL | 0 refills | Status: DC

## 2017-06-18 MED ORDER — IBUPROFEN 800 MG PO TABS: 800.0000 mg | ORAL_TABLET | Freq: Three times a day (TID) | ORAL | 0 refills | Status: DC

## 2017-06-18 NOTE — ED Provider Notes (Signed)
[facility] Provider Note   CSN: [id number] Arrival date & time: 06/18/17  1400     History   Chief Complaint Chief Complaint  Patient presents with  . URI    HPI Tracy Orr is a 42 y.o. female.  HPI Patient started 3 days ago, with cold symptoms.  She had nasal congestion sneezing and cough.  This is gotten worse with more productive cough, body aches and sore throat.  Patient works in a school environment.  She reports 1 of her coworkers did test positive for influenza A.  Patient was not aware that she is running a fever.  She has not been taking ibuprofen or Tylenol for symptoms. Past Medical History:  Diagnosis Date  . Fibroid   . No pertinent past medical history     Patient Active Problem List   Diagnosis Date Noted  . Fibroid uterus 01/21/2012    Class: Chronic    History reviewed. No pertinent surgical history.  OB History    Gravida Para Term Preterm AB Living   1 1 1  0 0 1   SAB TAB Ectopic Multiple Live Births   0 0 0 0 1       Home Medications    Prior to Admission medications   Medication Sig Start Date End Date Taking? Authorizing Provider  HYDROcodone-homatropine (HYCODAN) 5-1.5 MG/5ML syrup 5-10 mL every 6 hours as needed for severe cough. 06/18/17   Charlesetta Shanks, MD  ibuprofen (ADVIL,MOTRIN) 800 MG tablet Take 1 tablet (800 mg total) by mouth 3 (three) times daily. 06/18/17   Charlesetta Shanks, MD  Prenatal Vit-Fe Fumarate-FA (PRENATAL MULTIVITAMIN) TABS Take 1 tablet by mouth every morning.     [provider]  senna-docusate (SENOKOT-S) 8.6-50 MG per tablet Take 1 tablet by mouth daily.    [provider]  Spacer/Aero-Holding Chambers (AEROCHAMBER PLUS WITH MASK) inhaler Use as instructed 06/18/17   Charlesetta Shanks, MD    Family History Family History  Problem Relation Age of Onset  . Diabetes Mother   . Hypertension Mother   . Hypertension Father   . Hypertension Sister     Social  History Social History   Tobacco Use  . Smoking status: Never Smoker  . Smokeless tobacco: Never Used  Substance Use Topics  . Alcohol use: Yes    Comment: occ  . Drug use: No     Allergies   Patient has no known allergies.   Review of Systems Review of Systems 10 Systems reviewed and are negative for acute change except as noted in the HPI.   Physical Exam Updated Vital Signs BP 121/68 (BP Location: Right Arm)   Pulse 96   Temp 98.9 F (37.2 C) (Oral)   Resp 20   Ht 5\' 9"  (1.753 m)   Wt 79.4 kg (175 lb)   LMP 06/07/2017   SpO2 97%   BMI 25.84 kg/m   Physical Exam  Constitutional: She is oriented to person, place, and time. She appears well-developed and well-nourished. No distress.  HENT:  Head: Normocephalic and atraumatic.  Bilateral TMs normal.  Posterior oropharynx widely patent.  No erythema or exudates.  Mucosa boggy.  Patient sounds congested in the sneezing periodically  Eyes: Conjunctivae and EOM are normal.  Neck: Neck supple.  Cardiovascular: Normal rate, regular rhythm, normal heart sounds and intact distal pulses.  No murmur heard. Pulmonary/Chest: Effort normal and breath sounds normal. No respiratory distress.  Abdominal: Soft. She exhibits no distension.  There is no tenderness.  Musculoskeletal: Normal range of motion. She exhibits no edema.  Neurological: She is alert and oriented to person, place, and time. She exhibits normal muscle tone. Coordination normal.  Skin: Skin is warm and dry.  Psychiatric: She has a normal mood and affect.  Nursing note and vitals reviewed.    ED Treatments / Results  Labs (all labs ordered are listed, but only abnormal results are displayed) Labs Reviewed - No data to display  EKG  EKG Interpretation None       Radiology No results found.  Procedures Procedures (including critical care time)  Medications Ordered in ED Medications  albuterol (PROVENTIL HFA;VENTOLIN HFA) 108 (90 Base) MCG/ACT  inhaler 2 puff (not administered)  acetaminophen (TYLENOL) tablet 650 mg (650 mg Oral Given 06/18/17 1442)     Initial Impression / Assessment and Plan / ED Course  I have reviewed the triage vital signs and the nursing notes.  Pertinent labs & imaging results that were available during my care of the patient were reviewed by me and considered in my medical decision making (see chart for details).      Final Clinical Impressions(s) / ED Diagnoses   Final diagnoses:  Influenza-like illness   Patient has symptoms typical of influenza-like illness.  She does also have direct exposure to someone who was tested positively at her work environment.  Patient is nontoxic and alert.  Mental status clear.  No respiratory distress.  At this time she is stable for symptomatic treatment. ED Discharge Orders        Ordered    ibuprofen (ADVIL,MOTRIN) 800 MG tablet  3 times daily     06/18/17 1743    Spacer/Aero-Holding Chambers (AEROCHAMBER PLUS WITH MASK) inhaler     06/18/17 1743    HYDROcodone-homatropine (HYCODAN) 5-1.5 MG/5ML syrup     06/18/17 1743       Charlesetta Shanks, MD 06/18/17 1745

## 2017-06-18 NOTE — ED Triage Notes (Signed)
URI, body aches, fever x 3 days.

## 2017-06-18 NOTE — ED Notes (Signed)
Pt seen yesterday at urgent care for same, medications prescribed, see epic

## 2017-06-18 NOTE — ED Notes (Signed)
Pt teaching provided on medications that may cause drowsiness. Pt instructed not to drive or operate heavy machinery while taking the prescribed medication. Pt verbalized understanding.   

## 2018-08-08 ENCOUNTER — Encounter: Payer: Self-pay | Admitting: Allergy and Immunology

## 2018-08-08 ENCOUNTER — Ambulatory Visit: Payer: BC Managed Care – PPO | Admitting: Allergy and Immunology

## 2018-08-08 ENCOUNTER — Other Ambulatory Visit: Payer: Self-pay

## 2018-08-08 VITALS — BP 124/72 | HR 91 | Temp 97.5°F | Resp 20 | Ht 67.5 in | Wt 195.0 lb

## 2018-08-08 DIAGNOSIS — J3089 Other allergic rhinitis: Secondary | ICD-10-CM | POA: Insufficient documentation

## 2018-08-08 DIAGNOSIS — T7800XA Anaphylactic reaction due to unspecified food, initial encounter: Secondary | ICD-10-CM | POA: Insufficient documentation

## 2018-08-08 DIAGNOSIS — T7800XD Anaphylactic reaction due to unspecified food, subsequent encounter: Secondary | ICD-10-CM

## 2018-08-08 DIAGNOSIS — K2970 Gastritis, unspecified, without bleeding: Secondary | ICD-10-CM

## 2018-08-08 DIAGNOSIS — L500 Allergic urticaria: Secondary | ICD-10-CM | POA: Diagnosis not present

## 2018-08-08 MED ORDER — EPINEPHRINE 0.3 MG/0.3ML IJ SOAJ: 0.3000 mg | INTRAMUSCULAR | 1 refills | Status: DC | PRN

## 2018-08-08 MED ORDER — FLUTICASONE PROPIONATE 50 MCG/ACT NA SUSP: NASAL | 5 refills | Status: AC

## 2018-08-08 MED ORDER — FAMOTIDINE 20 MG PO TABS: 20.0000 mg | ORAL_TABLET | Freq: Two times a day (BID) | ORAL | 5 refills | Status: AC | PRN

## 2018-08-08 MED ORDER — LEVOCETIRIZINE DIHYDROCHLORIDE 5 MG PO TABS: 5.0000 mg | ORAL_TABLET | Freq: Every day | ORAL | 5 refills | Status: DC | PRN

## 2018-08-08 NOTE — Assessment & Plan Note (Signed)
   Aeroallergen avoidance measures have been discussed and provided in written form.  Levocetirizine has been prescribed (as above).  To avoid diminishing benefit with daily use (tachyphylaxis) of second generation antihistamine, consider alternating every few months between fexofenadine (Allegra) and levocetirizine (Xyzal).  A prescription has been provided for fluticasone nasal spray, one spray per nostril 1-2 times daily as needed. Proper nasal spray technique has been discussed and demonstrated.  Nasal saline spray (i.e., Simply Saline) or nasal saline lavage (i.e., NeilMed) is recommended as needed and prior to medicated nasal sprays.  If allergen avoidance measures and medications fail to adequately relieve symptoms, aeroallergen immunotherapy will be considered. 

## 2018-08-08 NOTE — Assessment & Plan Note (Addendum)
The patient's history suggests food allergy and positive skin test results today confirm this diagnosis.  Meticulous avoidance of shellfish as discussed.  Until lab results have returned, she will also continue to avoid grapes.  A prescription has been provided for epinephrine auto-injector 2 pack along with instructions for proper administration.  A food allergy action plan has been provided and discussed.  Medic Alert identification is recommended.

## 2018-08-08 NOTE — Patient Instructions (Addendum)
Recurrent urticaria Unclear etiology. Skin tests to select food allergens were negative today with the exception of shrimp, crab, lobster, oyster, and crab.  However, she avoids shellfish so these foods are not the culprits. NSAIDs and emotional stress commonly exacerbate urticaria but are not the underlying etiology in this case. Physical urticarias are negative by history (i.e. pressure-induced, temperature, vibration, solar, etc.). History and lesions are not consistent with urticaria pigmentosa so I am not suspicious for mastocytosis. There are no concomitant symptoms concerning for anaphylaxis . We will rule out other potential etiologies with labs. For symptom relief, patient is to take oral antihistamines as directed.  The following labs have been ordered: FCeRI antibody, anti-thyroglobulin antibody, thyroid peroxidase antibody, tryptase, urea breath test, CBC, CMP, ESR, ANA, and serum specific IgE against grape and alpha gal panel.  The patient will be called with further recommendations after lab results have returned.  Instructions have been discussed and provided for H1/H2 receptor blockade with titration to find lowest effective dose.  A prescription has been provided for levocetirizine (Xyzal), 5 mg daily as needed.  A prescription has been provided for famotidine (Pepcid), 20 mg twice daily as needed.  Should there be a significant increase or change in symptoms, a journal is to be kept recording any foods eaten, beverages consumed, medications taken within a 6 hour period prior to the onset of symptoms, as well as record activities being performed, and environmental conditions. For any symptoms concerning for anaphylaxis, 911 is to be called immediately.  Perennial allergic rhinitis  Aeroallergen avoidance measures have been discussed and provided in written form.  Levocetirizine has been prescribed (as above).  To avoid diminishing benefit with daily use (tachyphylaxis) of second  generation antihistamine, consider alternating every few months between fexofenadine (Allegra) and levocetirizine (Xyzal).  A prescription has been provided for fluticasone nasal spray, one spray per nostril 1-2 times daily as needed. Proper nasal spray technique has been discussed and demonstrated.  Nasal saline spray (i.e., Simply Saline) or nasal saline lavage (i.e., NeilMed) is recommended as needed and prior to medicated nasal sprays.  If allergen avoidance measures and medications fail to adequately relieve symptoms, aeroallergen immunotherapy will be considered.  Food allergy The patient's history suggests food allergy and positive skin test results today confirm this diagnosis.  Meticulous avoidance of shellfish as discussed.  Until lab results have returned, she will also continue to avoid grapes.  A prescription has been provided for epinephrine auto-injector 2 pack along with instructions for proper administration.  A food allergy action plan has been provided and discussed.  Medic Alert identification is recommended.    When lab results have returned the patient will be called with further recommendations and follow up instructions.  Urticaria (Hives)  . Levocetirizine (Xyzal) 5 mg twice a day and famotidine (Pepcid) 20 mg twice a day. If no symptoms for 7-14 days then decrease to. . Levocetirizine (Xyzal) 5 mg twice a day and famotidine (Pepcid) 20 mg once a day.  If no symptoms for 7-14 days then decrease to. . Levocetirizine (Xyzal) 5 mg twice a day.  If no symptoms for 7-14 days then decrease to. . Levocetirizine (Xyzal) 5 mg once a day.  May use Benadryl (diphenhydramine) as needed for breakthrough symptoms       If symptoms return, then step up dosage  Control of [place] dust mites play a major role in allergic asthma and rhinitis.  They occur in environments with high humidity wherever  human skin, the food for dust mites is found. High  levels have been detected in dust obtained from mattresses, pillows, carpets, upholstered furniture, bed covers, clothes and soft toys.  The principal allergen of the house dust mite is found in its feces.  A gram of dust may contain 1,000 mites and 250,000 fecal particles.  Mite antigen is easily measured in the air during house cleaning activities.    1. Encase mattresses, including the box spring, and pillow, in an air tight cover.  Seal the zipper end of the encased mattresses with wide adhesive tape. 2. Wash the bedding in water of 130 degrees Farenheit weekly.  Avoid cotton comforters/quilts and flannel bedding: the most ideal bed covering is the dacron comforter. 3. Remove all upholstered furniture from the bedroom. 4. Remove carpets, carpet padding, rugs, and non-washable window drapes from the bedroom.  Wash drapes weekly or use plastic window coverings. 5. Remove all non-washable stuffed toys from the bedroom.  Wash stuffed toys weekly. 6. Have the room cleaned frequently with a vacuum cleaner and a damp dust-mop.  The patient should not be in a room which is being cleaned and should wait 1 hour after cleaning before going into the room. 7. Close and seal all heating outlets in the bedroom.  Otherwise, the room will become filled with dust-laden air.  An electric heater can be used to heat the room. 8. Reduce indoor humidity to less than 50%.  Do not use a humidifier.  Control of Cockroach Allergen  Cockroach allergen has been identified as an important cause of acute attacks of asthma, especially in urban settings.  There are fifty-five species of cockroach that exist in the [place], however only three, the [place], [place] species produce allergen that can affect patients with Asthma.  Allergens can be obtained from fecal particles, egg casings and secretions from cockroaches.    1. Remove food sources. 2. Reduce access to water. 3. Seal access and entry  points. 4. Spray runways with 0.5-1% Diazinon or Chlorpyrifos 5. Blow boric acid power under stoves and refrigerator. 6. Place bait stations (hydramethylnon) at feeding sites.

## 2018-08-08 NOTE — Assessment & Plan Note (Addendum)
Unclear etiology. Skin tests to select food allergens were negative today with the exception of shrimp, crab, lobster, oyster, and crab.  However, she avoids shellfish so these foods are not the culprits. NSAIDs and emotional stress commonly exacerbate urticaria but are not the underlying etiology in this case. Physical urticarias are negative by history (i.e. pressure-induced, temperature, vibration, solar, etc.). History and lesions are not consistent with urticaria pigmentosa so I am not suspicious for mastocytosis. There are no concomitant symptoms concerning for anaphylaxis . We will rule out other potential etiologies with labs. For symptom relief, patient is to take oral antihistamines as directed.  The following labs have been ordered: FCeRI antibody, anti-thyroglobulin antibody, thyroid peroxidase antibody, tryptase, urea breath test, CBC, CMP, ESR, ANA, and serum specific IgE against grape and alpha gal panel.  The patient will be called with further recommendations after lab results have returned.  Instructions have been discussed and provided for H1/H2 receptor blockade with titration to find lowest effective dose.  A prescription has been provided for levocetirizine (Xyzal), 5 mg daily as needed.  A prescription has been provided for famotidine (Pepcid), 20 mg twice daily as needed.  Should there be a significant increase or change in symptoms, a journal is to be kept recording any foods eaten, beverages consumed, medications taken within a 6 hour period prior to the onset of symptoms, as well as record activities being performed, and environmental conditions. For any symptoms concerning for anaphylaxis, 911 is to be called immediately.

## 2018-08-08 NOTE — Progress Notes (Signed)
New Patient Note  RE: Tracy Orr MRN: [id number] DOB: Mar 14, 1976 Date of Office Visit: 08/08/2018  Referring provider: Virginia Rochester, PA Primary care provider: Patient, No Pcp Per  Chief Complaint: Urticaria; Food Intolerance; and Allergic Rhinitis   History of present illness: Tracy Orr is a 43 y.o. female seen today in consultation requested by Tracy Rochester, PA.  Over the past 5-6 years, Tracy Orr has experienced recurrent episodes of hives. The hives have appeared at different times over her entire body.  The lesions are described as erythematous, raised, and pruritic.  Individual hives last less than 24 hours without leaving residual pigmentation or bruising. She denies concomitant angioedema, cardiopulmonary symptoms and GI symptoms. She has not experienced unexpected weight loss, recurrent fevers or drenching night sweats.  For the first several years the hives only appeared when she was hot and sweaty from vigorous exercise.  However, over the past year the urticaria and pruritus have occurred more frequently without identified medication, food, skin care product, detergent, soap, or other environmental triggers. The symptoms do not seem to correlate with NSAIDs use or emotional stress. She did not have symptoms consistent with a respiratory tract infection at the time of symptom onset. Tracy Orr has tried to control symptoms with OTC antihistamines which have offered adequate temporary relief. She has not been evaluated and treated in the emergency department for these symptoms. Skin biopsy has not been performed. Recently, she has been experiencing hives daily. She experiences nasal congestion, postnasal drainage, and rhinorrhea "constantly."  No significant seasonal symptom variation has been noted nor have specific environmental triggers been identified.  She attempts to control the symptoms with cetirizine. Tracy Orr reports that in spring 2019 she consumed grapes and experienced  oral pruritus.  She did not experience concomitant urticaria, angioedema, cardiopulmonary symptoms, or GI symptoms.  She has avoided grapes since that time.  She also notes that she avoids shellfish because when she has consumed shellfish in the past it has triggered a "gagging" response.  Assessment and plan: Recurrent urticaria Unclear etiology. Skin tests to select food allergens were negative today with the exception of shrimp, crab, lobster, oyster, and crab.  However, she avoids shellfish so these foods are not the culprits. NSAIDs and emotional stress commonly exacerbate urticaria but are not the underlying etiology in this case. Physical urticarias are negative by history (i.e. pressure-induced, temperature, vibration, solar, etc.). History and lesions are not consistent with urticaria pigmentosa so I am not suspicious for mastocytosis. There are no concomitant symptoms concerning for anaphylaxis . We will rule out other potential etiologies with labs. For symptom relief, patient is to take oral antihistamines as directed.  The following labs have been ordered: FCeRI antibody, anti-thyroglobulin antibody, thyroid peroxidase antibody, tryptase, urea breath test, CBC, CMP, ESR, ANA, and serum specific IgE against grape and alpha gal panel.  The patient will be called with further recommendations after lab results have returned.  Instructions have been discussed and provided for H1/H2 receptor blockade with titration to find lowest effective dose.  A prescription has been provided for levocetirizine (Xyzal), 5 mg daily as needed.  A prescription has been provided for famotidine (Pepcid), 20 mg twice daily as needed.  Should there be a significant increase or change in symptoms, a journal is to be kept recording any foods eaten, beverages consumed, medications taken within a 6 hour period prior to the onset of symptoms, as well as record activities being performed, and environmental conditions.  For any symptoms concerning for  anaphylaxis, 911 is to be called immediately.  Perennial allergic rhinitis  Aeroallergen avoidance measures have been discussed and provided in written form.  Levocetirizine has been prescribed (as above).  To avoid diminishing benefit with daily use (tachyphylaxis) of second generation antihistamine, consider alternating every few months between fexofenadine (Allegra) and levocetirizine (Xyzal).  A prescription has been provided for fluticasone nasal spray, one spray per nostril 1-2 times daily as needed. Proper nasal spray technique has been discussed and demonstrated.  Nasal saline spray (i.e., Simply Saline) or nasal saline lavage (i.e., NeilMed) is recommended as needed and prior to medicated nasal sprays.  If allergen avoidance measures and medications fail to adequately relieve symptoms, aeroallergen immunotherapy will be considered.  Food allergy The patient's history suggests food allergy and positive skin test results today confirm this diagnosis.  Meticulous avoidance of shellfish as discussed.  Until lab results have returned, she will also continue to avoid grapes.  A prescription has been provided for epinephrine auto-injector 2 pack along with instructions for proper administration.  A food allergy action plan has been provided and discussed.  Medic Alert identification is recommended.    Meds ordered this encounter  Medications  . levocetirizine (XYZAL) 5 MG tablet    Sig: Take 1 tablet (5 mg total) by mouth daily as needed.    Dispense:  30 tablet    Refill:  5  . famotidine (PEPCID) 20 MG tablet    Sig: Take 1 tablet (20 mg total) by mouth 2 (two) times daily as needed.    Dispense:  60 tablet    Refill:  5  . fluticasone (FLONASE) 50 MCG/ACT nasal spray    Sig: Use 1 spray per nostril 1-2 times daily as needed    Dispense:  16 g    Refill:  5  . EPINEPHrine (AUVI-Q) 0.3 mg/0.3 mL IJ SOAJ injection    Sig: Inject 0.3 mLs  (0.3 mg total) into the muscle as needed for anaphylaxis.    Dispense:  2 Device    Refill:  1    Diagnostics: Environmental skin testing: Positive to dust mite antigen and cockroach antigen. Food allergen skin testing: Robust reactivity to shrimp, crab, lobster.  Positive to oyster and scallop.    Physical examination: Blood pressure 124/72, pulse 91, temperature (!) 97.5 F (36.4 C), temperature source Oral, resp. rate 20, height 5' 7.5" (1.715 m), weight 195 lb (88.5 kg), SpO2 97 %, currently breastfeeding.  General: Alert, interactive, in no acute distress. HEENT: TMs pearly gray, turbinates moderately edematous with clear discharge, post-pharynx moderately erythematous. Neck: Supple without lymphadenopathy. Lungs: Clear to auscultation without wheezing, rhonchi or rales. CV: Normal S1, S2 without murmurs. Abdomen: Nondistended, nontender. Skin: Warm and dry, without lesions or rashes. Extremities:  No clubbing, cyanosis or edema. Neuro:   Grossly intact.  Review of systems:  Review of systems negative except as noted in HPI / PMHx or noted below: Review of Systems  Constitutional: Negative.   HENT: Negative.   Eyes: Negative.   Respiratory: Negative.   Cardiovascular: Negative.   Gastrointestinal: Negative.   Genitourinary: Negative.   Musculoskeletal: Negative.   Skin: Negative.   Neurological: Negative.   Endo/Heme/Allergies: Negative.   Psychiatric/Behavioral: Negative.     Past medical history:  Past Medical History:  Diagnosis Date  . Fibroid   . No pertinent past medical history     Past surgical history:  History reviewed. No pertinent surgical history.  Family history: Family History  Problem Relation Age  of Onset  . Diabetes Mother   . Hypertension Mother   . Hypertension Father   . Hypertension Sister   . Asthma Sister   . Allergic rhinitis Sister   . Bronchitis Sister   . Eczema Neg Hx   . Urticaria Neg Hx   . Immunodeficiency Neg Hx   .  Angioedema Neg Hx   . Atopy Neg Hx     Social history: Social History   Socioeconomic History  . Marital status: Single    Spouse name: Not on file  . Number of children: Not on file  . Years of education: Not on file  . Highest education level: Not on file  Occupational History  . Not on file  Social Needs  . Financial resource strain: Not on file  . Food insecurity:    Worry: Not on file    Inability: Not on file  . Transportation needs:    Medical: Not on file    Non-medical: Not on file  Tobacco Use  . Smoking status: Never Smoker  . Smokeless tobacco: Never Used  Substance and Sexual Activity  . Alcohol use: Yes    Comment: occ  . Drug use: No  . Sexual activity: Yes    Birth control/protection: None  Lifestyle  . Physical activity:    Days per week: Not on file    Minutes per session: Not on file  . Stress: Not on file  Relationships  . Social connections:    Talks on phone: Not on file    Gets together: Not on file    Attends religious service: Not on file    Active member of club or organization: Not on file    Attends meetings of clubs or organizations: Not on file    Relationship status: Not on file  . Intimate partner violence:    Fear of current or ex partner: Not on file    Emotionally abused: Not on file    Physically abused: Not on file    Forced sexual activity: Not on file  Other Topics Concern  . Not on file  Social History Narrative  . Not on file   Environmental History: The patient lives in a 43 year old house with carpeting the bedroom and central air/heat.  She is a non-smoker without pets.  There is no known mold/water damage in the home.  Allergies as of 08/08/2018      Reactions   Shellfish Allergy    Positive allergy test 08/08/18      Medication List       Accurate as of August 08, 2018  5:02 PM. Always use your most recent med list.        cetirizine 10 MG tablet Commonly known as:  ZYRTEC Take by mouth.   EPINEPHrine  0.3 mg/0.3 mL Soaj injection Commonly known as:  Auvi-Q Inject 0.3 mLs (0.3 mg total) into the muscle as needed for anaphylaxis.   famotidine 20 MG tablet Commonly known as:  Pepcid Take 1 tablet (20 mg total) by mouth 2 (two) times daily as needed.   fluticasone 50 MCG/ACT nasal spray Commonly known as:  Flonase Use 1 spray per nostril 1-2 times daily as needed   levocetirizine 5 MG tablet Commonly known as:  XYZAL Take 1 tablet (5 mg total) by mouth daily as needed.       Known medication allergies: Allergies  Allergen Reactions  . Shellfish Allergy     Positive allergy test 08/08/18  I appreciate the opportunity to take part in Last[place]'s care. Please do not hesitate to contact me with questions.  Sincerely,   R. Edgar Frisk, MD

## 2018-08-10 LAB — H. PYLORI BREATH TEST: H pylori Breath Test: NEGATIVE

## 2018-08-17 ENCOUNTER — Other Ambulatory Visit: Payer: Self-pay | Admitting: *Deleted

## 2018-08-17 LAB — COMPREHENSIVE METABOLIC PANEL
ALT: 13 IU/L (ref 0–32)
AST: 13 IU/L (ref 0–40)
Albumin/Globulin Ratio: 1.7 (ref 1.2–2.2)
Albumin: 4.5 g/dL (ref 3.8–4.8)
Alkaline Phosphatase: 61 IU/L (ref 39–117)
BUN/Creatinine Ratio: 12 (ref 9–23)
BUN: 10 mg/dL (ref 6–24)
Bilirubin Total: 0.6 mg/dL (ref 0.0–1.2)
CO2: 25 mmol/L (ref 20–29)
Calcium: 9.3 mg/dL (ref 8.7–10.2)
Chloride: 102 mmol/L (ref 96–106)
Creatinine, Ser: 0.85 mg/dL (ref 0.57–1.00)
GFR calc Af Amer: 98 mL/min/{1.73_m2} (ref >59)
GFR calc non Af Amer: 85 mL/min/{1.73_m2} (ref >59)
Globulin, Total: 2.6 g/dL (ref 1.5–4.5)
Glucose: 89 mg/dL (ref 65–99)
Potassium: 4.6 mmol/L (ref 3.5–5.2)
Sodium: 137 mmol/L (ref 134–144)
Total Protein: 7.1 g/dL (ref 6.0–8.5)

## 2018-08-17 LAB — CBC WITH DIFFERENTIAL/PLATELET
Basophils Absolute: 0 10*3/uL (ref 0.0–0.2)
Basos: 1 %
EOS (ABSOLUTE): 0.2 10*3/uL (ref 0.0–0.4)
Eos: 3 %
Hematocrit: 42.8 % (ref 34.0–46.6)
Hemoglobin: 13.8 g/dL (ref 11.1–15.9)
Immature Grans (Abs): 0 10*3/uL (ref 0.0–0.1)
Immature Granulocytes: 0 %
Lymphocytes Absolute: 1.5 10*3/uL (ref 0.7–3.1)
Lymphs: 23 %
MCH: 30.1 pg (ref 26.6–33.0)
MCHC: 32.2 g/dL (ref 31.5–35.7)
MCV: 93 fL (ref 79–97)
Monocytes Absolute: 0.5 10*3/uL (ref 0.1–0.9)
Monocytes: 8 %
Neutrophils Absolute: 4.2 10*3/uL (ref 1.4–7.0)
Neutrophils: 65 %
Platelets: 310 10*3/uL (ref 150–450)
RBC: 4.59 x10E6/uL (ref 3.77–5.28)
RDW: 12.5 % (ref 11.7–15.4)
WBC: 6.4 10*3/uL (ref 3.4–10.8)

## 2018-08-17 LAB — CHRONIC URTICARIA: cu index: 3.8 (ref <10)

## 2018-08-17 LAB — ALPHA-GAL PANEL
Alpha Gal IgE*: <0.1 kU/L (ref <0.10)
Beef (Bos spp) IgE: 0.59 kU/L — ABNORMAL HIGH (ref <0.35)
Class Interpretation: 1
Lamb/Mutton (Ovis spp) IgE: 0.31 kU/L (ref <0.35)
Pork (Sus spp) IgE: 0.14 kU/L (ref <0.35)

## 2018-08-17 LAB — ALLERGEN GRAPE F259: Allergen Grape IgE: <0.1 kU/L

## 2018-08-17 LAB — SEDIMENTATION RATE: Sed Rate: 6 mm/hr (ref 0–32)

## 2018-08-17 LAB — THYROID PEROXIDASE ANTIBODY: Thyroperoxidase Ab SerPl-aCnc: 11 IU/mL (ref 0–34)

## 2018-08-17 LAB — ANA W/REFLEX IF POSITIVE: Anti Nuclear Antibody (ANA): NEGATIVE

## 2018-08-17 LAB — THYROGLOBULIN LEVEL: Thyroglobulin (TG-RIA): 31 ng/mL

## 2018-08-17 LAB — TRYPTASE: Tryptase: 5.8 ug/L (ref 2.2–13.2)

## 2018-08-17 MED ORDER — EPINEPHRINE 0.3 MG/0.3ML IJ SOAJ: 0.3000 mg | Freq: Once | INTRAMUSCULAR | 2 refills | Status: AC

## 2019-01-21 ENCOUNTER — Other Ambulatory Visit: Payer: Self-pay | Admitting: Allergy and Immunology

## 2019-09-16 ENCOUNTER — Other Ambulatory Visit: Payer: Self-pay | Admitting: Allergy and Immunology

## 2019-10-21 ENCOUNTER — Other Ambulatory Visit: Payer: Self-pay | Admitting: Allergy and Immunology

## 2020-01-28 ENCOUNTER — Other Ambulatory Visit: Payer: Self-pay | Admitting: Allergy and Immunology

## 2020-01-30 ENCOUNTER — Other Ambulatory Visit: Payer: Self-pay | Admitting: Allergy and Immunology

## 2020-01-30 MED ORDER — LEVOCETIRIZINE DIHYDROCHLORIDE 5 MG PO TABS: ORAL_TABLET | ORAL | 0 refills | Status: DC

## 2020-01-30 NOTE — Telephone Encounter (Signed)
Sent in courtesy refill to last until appt.

## 2020-01-30 NOTE — Telephone Encounter (Signed)
Pt request refill levocetirizine until her appt on 9/22.

## 2020-02-14 ENCOUNTER — Encounter: Payer: Self-pay | Admitting: Family Medicine

## 2020-02-14 ENCOUNTER — Ambulatory Visit: Payer: BC Managed Care – PPO | Admitting: Family Medicine

## 2020-02-14 ENCOUNTER — Other Ambulatory Visit: Payer: Self-pay

## 2020-02-14 VITALS — BP 112/80 | HR 92 | Temp 98.6°F | Resp 18 | Ht 67.5 in | Wt 170.2 lb

## 2020-02-14 DIAGNOSIS — L500 Allergic urticaria: Secondary | ICD-10-CM

## 2020-02-14 DIAGNOSIS — J3089 Other allergic rhinitis: Secondary | ICD-10-CM

## 2020-02-14 DIAGNOSIS — T7800XD Anaphylactic reaction due to unspecified food, subsequent encounter: Secondary | ICD-10-CM

## 2020-02-14 MED ORDER — LEVOCETIRIZINE DIHYDROCHLORIDE 5 MG PO TABS
ORAL_TABLET | ORAL | 5 refills | Status: DC
Start: 2020-02-14 — End: 2020-04-01

## 2020-02-14 MED ORDER — EPINEPHRINE 0.3 MG/0.3ML IJ SOAJ: 0.3000 mg | INTRAMUSCULAR | 3 refills | Status: DC | PRN

## 2020-02-14 NOTE — Progress Notes (Addendum)
Tracy Orr [id number] Dept: [phone]  FOLLOW UP NOTE  Patient ID: Tracy Orr, female    DOB: 27-Jun-1975  Age: 44 y.o. MRN: [id number] Date of Office Visit: 02/14/2020  Assessment  Chief Complaint: Allergies  HPI Tracy Orr is a 44 year old female who presents to the clinic for follow-up visit.  She was last seen in this clinic on 08/08/2018 by Dr. Verlin Fester for evaluation of recurrent urticaria rhinitis, and food allergy.  At today's visit she reports that her urticaria has been well controlled with levocetirizine 5 mg once a day with no breakouts since her last visit to this clinic.  She does report if she takes a levocetirizine longer than 24 hours after the last dose she begins to feel pruritus beginning in her hands which resolves once she takes the Xyzal.  Allergic rhinitis is reported as well controlled with levocetirizine 5 mg once a day.  She continues to avoid shellfish with no accidental ingestion or epinephrine ordered injector device use since her last visit to this clinic.   Drug Allergies:  Allergies  Allergen Reactions  . Shellfish Allergy     Positive allergy test 08/08/18    Physical Exam: BP 112/80   Pulse 92   Temp 98.6 F (37 C) (Oral)   Resp 18   Ht 5' 7.5" (1.715 m)   Wt 170 lb 3.2 oz (77.2 kg)   SpO2 99%   BMI 26.26 kg/m    Physical Exam Vitals reviewed.  Constitutional:      Appearance: Normal appearance.  HENT:     Head: Normocephalic and atraumatic.     Right Ear: Tympanic membrane normal.     Left Ear: Tympanic membrane normal.     Nose:     Comments: Bilateral nares normal.  Pharynx normal.  Ears normal.  Eyes normal.    Mouth/Throat:     Pharynx: Oropharynx is clear.  Eyes:     Conjunctiva/sclera: Conjunctivae normal.  Cardiovascular:     Rate and Rhythm: Normal rate and regular rhythm.     Heart sounds: Normal heart sounds. No murmur heard.   Pulmonary:     Effort: Pulmonary effort is normal.     Breath  sounds: Normal breath sounds.     Comments: Lungs clear to auscultation Musculoskeletal:        General: Normal range of motion.     Cervical back: Normal range of motion and neck supple.  Skin:    General: Skin is warm and dry.  Neurological:     Mental Status: She is alert and oriented to person, place, and time.  Psychiatric:        Mood and Affect: Mood normal.        Behavior: Behavior normal.        Thought Content: Thought content normal.        Judgment: Judgment normal.     Assessment and Plan: 1. Recurrent urticaria   2. Perennial allergic rhinitis   3. Anaphylactic shock due to food, subsequent encounter     Meds ordered this encounter  Medications  . levocetirizine (XYZAL) 5 MG tablet    Sig: TAKE 1 TABLET(5 MG) BY MOUTH DAILY AS NEEDED    Dispense:  30 tablet    Refill:  5    Pt appt 02/14/20.  Marland Kitchen EPINEPHrine (AUVI-Q) 0.3 mg/0.3 mL IJ SOAJ injection    Sig: Inject 0.3 mg into the muscle as needed for anaphylaxis.  Dispense:  1 each    Refill:  3    Patient Instructions  Hives/itch  . Levocetirizine (Xyzal) 5 mg twice a day and famotidine (Pepcid) 20 mg twice a day. If no symptoms for 7-14 days then decrease to. . Levocetirizine (Xyzal) 5 mg twice a day and famotidine (Pepcid) 20 mg once a day.  If no symptoms for 7-14 days then decrease to. . Levocetirizine (Xyzal) 5 mg twice a day.  If no symptoms for 7-14 days then decrease to. . Levocetirizine (Xyzal) 5 mg once a day. If your symptoms re-occur, begin a journal of events that occurred for up to 6 hours before your symptoms began including foods and beverages consumed, soaps or perfumes you had contact with, and medications.   Food allergy Continue to avoid shellfish.  In case of an allergic reaction, take Benadryl 50 mg every 4 hours, and if life-threatening symptoms occur, inject with AuviQ 0.3 mg.  Allergic rhinitis Continue Xyzal 5 mg once a day as needed for a runny nose Consider saline nasal  rinses as needed for nasal symptoms. Use this before any medicated nasal sprays for best result  Call the clinic if this treatment plan is not working well for you  Follow up in 1 year or sooner if needed.   Return in about 1 year (around 02/13/2021), or if symptoms worsen or fail to improve.    Thank you for the opportunity to care for this patient.  Please do not hesitate to contact me with questions.  Gareth Morgan, FNP [facility] [place]  ________________________________________________  I have provided oversight concerning [facility]'s evaluation and treatment of this patient's health issues addressed during today's encounter.  I agree with the assessment and therapeutic plan as outlined in the note.   Signed,   R Edgar Frisk, MD

## 2020-02-14 NOTE — Patient Instructions (Signed)
Hives/itch  . Levocetirizine (Xyzal) 5 mg twice a day and famotidine (Pepcid) 20 mg twice a day. If no symptoms for 7-14 days then decrease to. . Levocetirizine (Xyzal) 5 mg twice a day and famotidine (Pepcid) 20 mg once a day.  If no symptoms for 7-14 days then decrease to. . Levocetirizine (Xyzal) 5 mg twice a day.  If no symptoms for 7-14 days then decrease to. . Levocetirizine (Xyzal) 5 mg once a day. If your symptoms re-occur, begin a journal of events that occurred for up to 6 hours before your symptoms began including foods and beverages consumed, soaps or perfumes you had contact with, and medications.   Food allergy Continue to avoid shellfish.  In case of an allergic reaction, take Benadryl 50 mg every 4 hours, and if life-threatening symptoms occur, inject with AuviQ 0.3 mg.  Allergic rhinitis Continue Xyzal 5 mg once a day as needed for a runny nose Consider saline nasal rinses as needed for nasal symptoms. Use this before any medicated nasal sprays for best result  Call the clinic if this treatment plan is not working well for you  Follow up in 1 year or sooner if needed.

## 2020-02-15 ENCOUNTER — Telehealth: Payer: Self-pay | Admitting: Family Medicine

## 2020-02-15 NOTE — Telephone Encounter (Signed)
LMOM for patient to call the clinic regarding tachyphylaxis with levocetirizine. If she feels as though levocetirizine is not working as well as it used to, she can alternate between levocetirizine and Allegra every 1-2 months for better control of itch.

## 2020-02-29 NOTE — Telephone Encounter (Signed)
lmom 

## 2020-03-29 ENCOUNTER — Other Ambulatory Visit: Payer: Self-pay | Admitting: Allergy and Immunology

## 2020-12-23 ENCOUNTER — Other Ambulatory Visit: Payer: Self-pay | Admitting: Family Medicine

## 2021-04-02 ENCOUNTER — Other Ambulatory Visit: Payer: Self-pay | Admitting: Family Medicine

## 2021-04-03 ENCOUNTER — Other Ambulatory Visit: Payer: Self-pay | Admitting: Family Medicine

## 2021-04-30 NOTE — Patient Instructions (Signed)
Hives/itch  Levocetirizine (Xyzal) 5 mg twice a day and famotidine (Pepcid) 20 mg twice a day. If no symptoms for 7-14 days then decrease to. Levocetirizine (Xyzal) 5 mg twice a day and famotidine (Pepcid) 20 mg once a day.  If no symptoms for 7-14 days then decrease to. Levocetirizine (Xyzal) 5 mg twice a day.  If no symptoms for 7-14 days then decrease to. Levocetirizine (Xyzal) 5 mg once a day. If your symptoms re-occur, begin a journal of events that occurred for up to 6 hours before your symptoms began including foods and beverages consumed, soaps or perfumes you had contact with, and medications.   Food allergy Continue to avoid shellfish.  In case of an allergic reaction, take Benadryl 50 mg every 4 hours, and if life-threatening symptoms occur, inject with EpiPen 0.3 mg. She does not wish to do blood work at this time  Allergic rhinitis Continue Xyzal 5 mg once a day as needed for a runny nose Consider saline nasal rinses as needed for nasal symptoms. Use this before any medicated nasal sprays for best result  Call the clinic if this treatment plan is not working well for you  Follow up in 1 year or sooner if needed.

## 2021-05-01 ENCOUNTER — Ambulatory Visit: Payer: BC Managed Care – PPO | Admitting: Family

## 2021-05-01 ENCOUNTER — Encounter: Payer: Self-pay | Admitting: Family

## 2021-05-01 ENCOUNTER — Other Ambulatory Visit: Payer: Self-pay

## 2021-05-01 VITALS — BP 120/62 | HR 78 | Temp 98.1°F | Resp 17 | Ht 67.5 in | Wt 184.0 lb

## 2021-05-01 DIAGNOSIS — L500 Allergic urticaria: Secondary | ICD-10-CM | POA: Diagnosis not present

## 2021-05-01 DIAGNOSIS — T7800XD Anaphylactic reaction due to unspecified food, subsequent encounter: Secondary | ICD-10-CM

## 2021-05-01 DIAGNOSIS — J3089 Other allergic rhinitis: Secondary | ICD-10-CM

## 2021-05-01 MED ORDER — LEVOCETIRIZINE DIHYDROCHLORIDE 5 MG PO TABS: 5.0000 mg | ORAL_TABLET | ORAL | 6 refills | Status: AC | PRN

## 2021-05-01 MED ORDER — EPINEPHRINE 0.3 MG/0.3ML IJ SOAJ: 0.3000 mg | INTRAMUSCULAR | 1 refills | Status: AC | PRN

## 2021-05-01 NOTE — Progress Notes (Signed)
[place] [id number] Dept: [phone]  FOLLOW UP NOTE  Patient ID: Tracy Orr, female    DOB: 02-Sep-1975  Age: 45 y.o. MRN: [id number] Date of Office Visit: 05/01/2021  Assessment  Chief Complaint: No chief complaint on file.  HPI Tracy Orr is a 45 year old female who presents today for follow-up of recurrent urticaria, perennial allergic rhinitis, and anaphylactic shock due to food.  She was last seen on February 14, 2020 by Gareth Morgan, FNP.  She reports that in July she had surgery for her uterine fibroids.  Recurrent urticaria is reported as controlled as long as she takes her Xyzal 5 mg once a day.  She mentions that if she forgets to take her Xyzal for a few days she will begin having itching on her hands and then her neck.  She does not ever see any hives.  As soon as she takes Xyzal the itching stops immediately.  She continues to avoid shellfish without any accidental ingestion or use of her Auvi-Q.  When asked what her reaction was to shellfish she reports that she had a positive skin test.  She is not interested at this time and doing blood work to see if she is still allergic.  She mentions that in the past she ate shrimp and did not like the texture.  Allergic rhinitis is reported as moderately controlled with Xyzal 5 mg once a day.  She reports occasional rhinorrhea and nasal congestion.  She denies postnasal drip.  She has not had any sinus infections since we last saw her.   Drug Allergies:  Allergies  Allergen Reactions   Shellfish Allergy     Positive allergy test 08/08/18    Review of Systems: Review of Systems  Constitutional:  Negative for chills and fever.  HENT:         Reports occasional rhinorrhea and nasal congestion.  Denies postnasal drip.  Eyes:        Denies itchy watery eyes  Respiratory:  Negative for cough, shortness of breath and wheezing.   Gastrointestinal:        Denies heartburn or reflux symptoms  Genitourinary:   Negative for frequency.  Skin:  Positive for itching. Negative for rash.       Reports itching if she forgets to take her Xyzal a day or 2   Neurological:  Negative for headaches.  Endo/Heme/Allergies:  Positive for environmental allergies.    Physical Exam: BP 120/62   Pulse 78   Temp 98.1 F (36.7 C) (Temporal)   Resp 17   Ht 5' 7.5" (1.715 m)   Wt 184 lb (83.5 kg)   SpO2 100%   BMI 28.39 kg/m    Physical Exam Constitutional:      Appearance: Normal appearance.  HENT:     Head: Normocephalic and atraumatic.     Comments: Pharynx normal, eyes normal, ears normal, nose: Bilateral lower turbinates mildly edematous with no drainage noted    Right Ear: Tympanic membrane, ear canal and external ear normal.     Left Ear: Tympanic membrane, ear canal and external ear normal.  Eyes:     Conjunctiva/sclera: Conjunctivae normal.  Cardiovascular:     Rate and Rhythm: Regular rhythm.     Heart sounds: Normal heart sounds.  Pulmonary:     Effort: Pulmonary effort is normal.     Breath sounds: Normal breath sounds.     Comments: Lungs clear to auscultation Musculoskeletal:  Cervical back: Neck supple.  Skin:    General: Skin is warm.     Comments: No rashes or urticarial lesions noted  Neurological:     Mental Status: She is alert and oriented to person, place, and time.  Psychiatric:        Mood and Affect: Mood normal.        Behavior: Behavior normal.        Thought Content: Thought content normal.        Judgment: Judgment normal.    Diagnostics:  None  Assessment and Plan: 1. Recurrent urticaria   2. Anaphylactic shock due to food, subsequent encounter   3. Perennial allergic rhinitis     Meds ordered this encounter  Medications   levocetirizine (XYZAL) 5 MG tablet    Sig: Take 1 tablet (5 mg total) by mouth as needed for allergies.    Dispense:  30 tablet    Refill:  6   EPINEPHrine 0.3 mg/0.3 mL IJ SOAJ injection    Sig: Inject 0.3 mg into the muscle as  needed for anaphylaxis.    Dispense:  1 each    Refill:  1    Ok to dispense generic mylan or generic teva    Patient Instructions  Hives/itch  Levocetirizine (Xyzal) 5 mg twice a day and famotidine (Pepcid) 20 mg twice a day. If no symptoms for 7-14 days then decrease to. Levocetirizine (Xyzal) 5 mg twice a day and famotidine (Pepcid) 20 mg once a day.  If no symptoms for 7-14 days then decrease to. Levocetirizine (Xyzal) 5 mg twice a day.  If no symptoms for 7-14 days then decrease to. Levocetirizine (Xyzal) 5 mg once a day. If your symptoms re-occur, begin a journal of events that occurred for up to 6 hours before your symptoms began including foods and beverages consumed, soaps or perfumes you had contact with, and medications.   Food allergy Continue to avoid shellfish.  In case of an allergic reaction, take Benadryl 50 mg every 4 hours, and if life-threatening symptoms occur, inject with EpiPen 0.3 mg. She does not wish to do blood work at this time  Allergic rhinitis Continue Xyzal 5 mg once a day as needed for a runny nose Consider saline nasal rinses as needed for nasal symptoms. Use this before any medicated nasal sprays for best result  Call the clinic if this treatment plan is not working well for you  Follow up in 1 year or sooner if needed.  Return in about 1 year (around 05/01/2022), or if symptoms worsen or fail to improve.    Thank you for the opportunity to care for this patient.  Please do not hesitate to contact me with questions.  Althea Charon, FNP [facility]
